# Patient Record
Sex: Female | Born: 2005
Health system: Southern US, Community
[De-identification: ages and names within clinical notes are randomized; demographics above are authoritative.]

## PROBLEM LIST (undated history)

## (undated) DIAGNOSIS — S060XAA Concussion with loss of consciousness status unknown, initial encounter: Secondary | ICD-10-CM

## (undated) DIAGNOSIS — S060X9A Concussion with loss of consciousness of unspecified duration, initial encounter: Secondary | ICD-10-CM

## (undated) HISTORY — DX: Concussion with loss of consciousness status unknown, initial encounter: S06.0XAA

## (undated) HISTORY — PX: NO PAST SURGERIES: SHX2092

## (undated) HISTORY — DX: Concussion with loss of consciousness of unspecified duration, initial encounter: S06.0X9A

---
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
## 2024-01-03 DIAGNOSIS — K5900 Constipation, unspecified: Secondary | ICD-10-CM | POA: Diagnosis not present

## 2024-01-07 DIAGNOSIS — K9049 Malabsorption due to intolerance, not elsewhere classified: Secondary | ICD-10-CM | POA: Diagnosis not present

## 2024-01-07 DIAGNOSIS — K5900 Constipation, unspecified: Secondary | ICD-10-CM | POA: Diagnosis not present

## 2024-01-25 DIAGNOSIS — F419 Anxiety disorder, unspecified: Secondary | ICD-10-CM | POA: Diagnosis not present

## 2024-02-02 DIAGNOSIS — M79662 Pain in left lower leg: Secondary | ICD-10-CM | POA: Diagnosis not present

## 2024-02-05 DIAGNOSIS — S86112A Strain of other muscle(s) and tendon(s) of posterior muscle group at lower leg level, left leg, initial encounter: Secondary | ICD-10-CM | POA: Diagnosis not present

## 2024-02-26 DIAGNOSIS — Z23 Encounter for immunization: Secondary | ICD-10-CM | POA: Diagnosis not present

## 2024-02-26 DIAGNOSIS — S91311A Laceration without foreign body, right foot, initial encounter: Secondary | ICD-10-CM | POA: Diagnosis not present

## 2024-05-10 DIAGNOSIS — R42 Dizziness and giddiness: Secondary | ICD-10-CM | POA: Diagnosis not present

## 2024-05-10 DIAGNOSIS — F419 Anxiety disorder, unspecified: Secondary | ICD-10-CM | POA: Diagnosis not present

## 2024-05-10 DIAGNOSIS — Z862 Personal history of diseases of the blood and blood-forming organs and certain disorders involving the immune mechanism: Secondary | ICD-10-CM | POA: Diagnosis not present

## 2024-05-12 DIAGNOSIS — Z862 Personal history of diseases of the blood and blood-forming organs and certain disorders involving the immune mechanism: Secondary | ICD-10-CM | POA: Diagnosis not present

## 2024-06-15 DIAGNOSIS — R42 Dizziness and giddiness: Secondary | ICD-10-CM | POA: Diagnosis not present

## 2024-06-15 DIAGNOSIS — N946 Dysmenorrhea, unspecified: Secondary | ICD-10-CM | POA: Diagnosis not present

## 2024-06-15 DIAGNOSIS — R55 Syncope and collapse: Secondary | ICD-10-CM | POA: Diagnosis not present

## 2024-08-11 DIAGNOSIS — J029 Acute pharyngitis, unspecified: Secondary | ICD-10-CM | POA: Diagnosis not present

## 2024-09-06 DIAGNOSIS — J180 Bronchopneumonia, unspecified organism: Secondary | ICD-10-CM | POA: Diagnosis not present

## 2024-10-25 ENCOUNTER — Ambulatory Visit: Admitting: Family Medicine

## 2024-10-25 ENCOUNTER — Encounter: Payer: Self-pay | Admitting: Family Medicine

## 2024-10-25 VITALS — BP 102/82 | HR 71 | Ht 64.39 in | Wt 154.0 lb

## 2024-10-25 DIAGNOSIS — G901 Familial dysautonomia [Riley-Day]: Secondary | ICD-10-CM

## 2024-10-25 DIAGNOSIS — D500 Iron deficiency anemia secondary to blood loss (chronic): Secondary | ICD-10-CM | POA: Insufficient documentation

## 2024-10-25 DIAGNOSIS — M357 Hypermobility syndrome: Secondary | ICD-10-CM | POA: Insufficient documentation

## 2024-10-25 DIAGNOSIS — N946 Dysmenorrhea, unspecified: Secondary | ICD-10-CM | POA: Insufficient documentation

## 2024-10-25 LAB — CBC WITH DIFFERENTIAL/PLATELET
Basophils Absolute: 0 K/uL (ref 0.0–0.1)
Basophils Relative: 0.8 % (ref 0.0–3.0)
Eosinophils Absolute: 0.1 K/uL (ref 0.0–0.7)
Eosinophils Relative: 2.4 % (ref 0.0–5.0)
HCT: 41 % (ref 36.0–49.0)
Hemoglobin: 14 g/dL (ref 12.0–16.0)
Lymphocytes Relative: 47 % (ref 24.0–48.0)
Lymphs Abs: 2.7 K/uL (ref 0.7–4.0)
MCHC: 34.1 g/dL (ref 31.0–37.0)
MCV: 84.8 fl (ref 78.0–98.0)
Monocytes Absolute: 0.4 K/uL (ref 0.1–1.0)
Monocytes Relative: 6.9 % (ref 3.0–12.0)
Neutro Abs: 2.5 K/uL (ref 1.4–7.7)
Neutrophils Relative %: 42.9 % — ABNORMAL LOW (ref 43.0–71.0)
Platelets: 279 K/uL (ref 150.0–575.0)
RBC: 4.84 Mil/uL (ref 3.80–5.70)
RDW: 12.6 % (ref 11.4–15.5)
WBC: 5.8 K/uL (ref 4.5–13.5)

## 2024-10-25 NOTE — Progress Notes (Signed)
 I, Lisa [name], CMA acting as a scribe for Lisa Lloyd, MD.  Lisa [name] is a 18 y.o. female who presents to [facility] today for evaluation of her hypermobility.  MS: Chronic joint pain, Joint Hypermobility, and Joint Instability Skin/Immune reactions: Hyperextensible/ stretchy Skin, Abnormal Stretch Marks Before Puberty / without significant weight gain , Rashes / Hives, and Medication / Chemical / Food Sensitivities  Neurological: Headache  Migraine, Syncope / Pre-Syncope, Clumsiness , Burning, Numbness and/or Tingling in Extremities, Sleep Disturbance, and Sensory Sensitivities ANS: Dysautonomia / POTS / Orthostatic Intolerance, Syncope / Pre-Syncope / Dizziness, and Anxiety / Panic Respiratory: Chest Tightness CV: Tachycardia GI:  GERD, Painful ABD Bloating, and Frequent N/V Genitourinary: Pelvic Pain / Fullness / Pressure Hands & Feet: Raynaud's Phenomenon   Treatments tried: completed formal PT for the knee years ago. No allergy or asthmas meds.   Pertinent review of systems: No fevers or chills  Relevant historical information: Chronic constipation already tried MiraLAX and fiber and other lifestyle changes and conservative management strategies.   Exam:  BP 102/82   Pulse 71   Ht 5' 4.39 (1.636 m)   Wt 154 lb (69.9 kg)   SpO2 100%   BMI 26.11 kg/m  General: Well Developed, well nourished, and in no acute distress.   MSK: Hypermobility evaluation is positive with a Beighton score of 6/9    Lab and Radiology Results No results found for this or any previous visit (from the past 72 hours). No results found.     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  Mom does have a diagnosis of hypermobile Ehlers-Danlos syndrome.  Fortunately Lisa [name] has her pain well-managed enough that she will not be criteria for H EDS today however if that changes she would.  She does have some chronic associated issues ongoing.   Dominant symptoms are dysautonomia/POTS and chronic constipation.  Additionally she has a history of iron deficiency anemia.  Plan to address POTS or dysautonomia with salt and fluid and exercise goals.  Will check iron stores for history of iron deficiency anemia.  Provided information about chronic idiopathic constipation.  Consider Linzess in the future.  Patient is already done a good job with conservative management.  Lastly will refer to OB/GYN.  Patient would like a second opinion regarding her dysmenorrhea management strategies.   PDMP not reviewed this encounter. Orders Placed This Encounter  Procedures   CBC with Differential/Platelet    Standing Status:   Future    Number of Occurrences:   1    Expiration Date:   10/25/2025   Iron, TIBC and Ferritin Panel    Standing Status:   Future    Number of Occurrences:   1    Expiration Date:   10/25/2025   Ambulatory referral to Obstetrics / Gynecology    Referral Priority:   Routine    Referral Type:   Consultation    Referral Reason:   Specialty Services Required    Requested Specialty:   Obstetrics and Gynecology    Number of Visits Requested:   1   No orders of the defined types were placed in this encounter.    Discussed warning signs or symptoms. Please see discharge instructions. Patient expresses understanding.   The above documentation has been reviewed and is accurate and complete Lisa [name], M.D. Total encounter time 30 minutes including face-to-face time with the patient and, reviewing past medical record, and charting on the date of  service.

## 2024-10-25 NOTE — Patient Instructions (Addendum)
 Thank you for coming in today.   Please get labs today before you leave  Ok to take Vit D and B12 over the counter.   For chronic idiopathic constipation consider Linaclotide (brand names Linzess).  I've referred you to OBGYN.  Let us  know if you don't hear from them in one week.  Check back in 1 month  Check out the book Disjointed Navigating the Diagnosis and Management of Hypermobile Ehlers-Danlos Syndrome and Hypermobility Spectrum Disorders.     For POTS symptoms: Consume 3 L water and 8-12 gram of salt per day    Check out these websites for exercises to try if you have POTS (CHOP Protocol, [place] Protocol, and [employer] Protocol) [email or web address].taylor-robbins.[email or web address]/ https://dean.info/    Guide to Prof. Consuelo Patient Self-Care Handouts https://[email or web address].Http://www.black-smith.org/   Orthostatic Intolerance and Postural Orthostatic Tachycardia Self-Care Checklist [email or web address].Wvlyxc.com   Steps To Managing Your Mast Cell Activation Syndrome [email or web address].pdf

## 2024-10-26 ENCOUNTER — Ambulatory Visit: Payer: Self-pay | Admitting: Family Medicine

## 2024-10-26 LAB — IRON,TIBC AND FERRITIN PANEL
%SAT: 32 % (ref 15–45)
Ferritin: 26 ng/mL (ref 16–154)
Iron: 110 ug/dL (ref 27–164)
TIBC: 348 ug/dL (ref 271–448)

## 2024-10-26 NOTE — Progress Notes (Signed)
 Iron stores look adequate.  Plan to continue current oral iron.

## 2024-11-15 ENCOUNTER — Ambulatory Visit: Admitting: Family Medicine

## 2024-12-02 ENCOUNTER — Ambulatory Visit: Admitting: Family Medicine
